# Patient Record
Sex: Female | Born: 1990 | Race: White | Hispanic: No | Marital: Married | Smoking: Never smoker
Health system: Southern US, Community
[De-identification: ages and names within clinical notes are randomized; demographics above are authoritative.]

## PROBLEM LIST (undated history)

## (undated) ENCOUNTER — Inpatient Hospital Stay (HOSPITAL_COMMUNITY): Payer: Self-pay

---

## 2015-03-29 ENCOUNTER — Inpatient Hospital Stay (HOSPITAL_COMMUNITY): Payer: 59

## 2015-03-29 ENCOUNTER — Inpatient Hospital Stay (HOSPITAL_COMMUNITY)
Admission: AD | Admit: 2015-03-29 | Discharge: 2015-03-29 | Disposition: A | Discharge status: Home / Self Care | Payer: 59 | Source: Ambulatory Visit | Attending: Obstetrics & Gynecology | Admitting: Obstetrics & Gynecology

## 2015-03-29 ENCOUNTER — Encounter (HOSPITAL_COMMUNITY): Payer: Self-pay

## 2015-03-29 DIAGNOSIS — O4691 Antepartum hemorrhage, unspecified, first trimester: Secondary | ICD-10-CM | POA: Diagnosis not present

## 2015-03-29 DIAGNOSIS — R109 Unspecified abdominal pain: Secondary | ICD-10-CM | POA: Diagnosis not present

## 2015-03-29 DIAGNOSIS — O209 Hemorrhage in early pregnancy, unspecified: Secondary | ICD-10-CM | POA: Diagnosis not present

## 2015-03-29 DIAGNOSIS — Z3A00 Weeks of gestation of pregnancy not specified: Secondary | ICD-10-CM | POA: Diagnosis not present

## 2015-03-29 LAB — WET PREP, GENITAL
Clue Cells Wet Prep HPF POC: NONE SEEN
Sperm: NONE SEEN
Trich, Wet Prep: NONE SEEN
YEAST WET PREP: NONE SEEN

## 2015-03-29 LAB — CBC
HCT: 37.6 % (ref 36.0–46.0)
HEMOGLOBIN: 12.5 g/dL (ref 12.0–15.0)
MCH: 30.2 pg (ref 26.0–34.0)
MCHC: 33.2 g/dL (ref 30.0–36.0)
MCV: 90.8 fL (ref 78.0–100.0)
PLATELETS: 325 10*3/uL (ref 150–400)
RBC: 4.14 MIL/uL (ref 3.87–5.11)
RDW: 13.7 % (ref 11.5–15.5)
WBC: 10.6 10*3/uL — ABNORMAL HIGH (ref 4.0–10.5)

## 2015-03-29 LAB — URINALYSIS, ROUTINE W REFLEX MICROSCOPIC
BILIRUBIN URINE: NEGATIVE
Glucose, UA: NEGATIVE mg/dL
Ketones, ur: NEGATIVE mg/dL
Leukocytes, UA: NEGATIVE
NITRITE: NEGATIVE
PH: 5.5 (ref 5.0–8.0)
Protein, ur: NEGATIVE mg/dL

## 2015-03-29 LAB — URINE MICROSCOPIC-ADD ON
BACTERIA UA: NONE SEEN
WBC, UA: NONE SEEN WBC/hpf (ref 0–5)

## 2015-03-29 LAB — HCG, QUANTITATIVE, PREGNANCY: HCG, BETA CHAIN, QUANT, S: 3926 m[IU]/mL — AB (ref <5)

## 2015-03-29 LAB — ABO/RH: ABO/RH(D): O POS

## 2015-03-29 LAB — POCT PREGNANCY, URINE: Preg Test, Ur: POSITIVE — AB

## 2015-03-29 NOTE — MAU Provider Note (Signed)
CSN: [id number]     Arrival date & time 03/29/15  1735 History   None    Chief Complaint  Patient presents with  . Vaginal Bleeding     (Consider location/radiation/quality/duration/timing/severity/associated sxs/prior Treatment) HPI Audrey Galloway is a 25 y.o. G1P0 with vaginal bleeding in early pregnancy. Bleeding started heavy 2 days ago with bad cramping, today less bleeding and mild cramping. Patient reports that she went to her OB/GYN in [place], [place] yesterday and they did a BHCG and told her to return on Monday for follow up. Patient states that she came here because one of her friend told her to come and get an ultrasound.   History reviewed. No pertinent past medical history. History reviewed. No pertinent past surgical history. History reviewed. No pertinent family history. Social History  Substance Use Topics  . Smoking status: None  . Smokeless tobacco: None  . Alcohol Use: None   OB History    Gravida Para Term Preterm AB TAB SAB Ectopic Multiple Living   1              Review of Systems  Gastrointestinal: Positive for abdominal pain.  Genitourinary: Positive for vaginal bleeding.  Neurological: Positive for light-headedness.  all other systems negative    Allergies  Review of patient's allergies indicates no known allergies.  Home Medications   Prior to Admission medications   Not on File   BP 144/81 mmHg  Pulse 93  Temp(Src) 98.6 F (37 C) (Oral)  Resp 16 Physical Exam  Constitutional: She is oriented to person, place, and time. She appears well-developed and well-nourished.  HENT:  Head: Normocephalic and atraumatic.  Eyes: Conjunctivae and EOM are normal.  Neck: Normal range of motion. Neck supple.  Cardiovascular: Normal rate.   Pulmonary/Chest: Effort normal.  Abdominal: Soft. There is tenderness.  Mildly tender lower abdomen  Genitourinary:  External genitalia without lesions, moderate blood vaginal vault. Cervix closed, long, no CMT,  no adnexal tenderness or mass palpated.   Musculoskeletal: Normal range of motion.  Neurological: She is alert and oriented to person, place, and time. No cranial nerve deficit.  Skin: Skin is warm and dry.  Psychiatric: She has a normal mood and affect. Her behavior is normal.  Nursing note and vitals reviewed.   ED Course  Procedures (including critical care time) Labs Review Results for orders placed or performed during the hospital encounter of 03/29/15 (from the past 24 hour(s))  Urinalysis, Routine w reflex microscopic (not at [facility])     Status: Abnormal   Collection Time: 03/29/15  5:48 PM  Result Value Ref Range   Color, Urine YELLOW YELLOW   APPearance HAZY (A) CLEAR   Specific Gravity, Urine >1.030 (H) 1.005 - 1.030   pH 5.5 5.0 - 8.0   Glucose, UA NEGATIVE NEGATIVE mg/dL   Hgb urine dipstick LARGE (A) NEGATIVE   Bilirubin Urine NEGATIVE NEGATIVE   Ketones, ur NEGATIVE NEGATIVE mg/dL   Protein, ur NEGATIVE NEGATIVE mg/dL   Nitrite NEGATIVE NEGATIVE   Leukocytes, UA NEGATIVE NEGATIVE  Urine microscopic-add on     Status: Abnormal   Collection Time: 03/29/15  5:48 PM  Result Value Ref Range   Squamous Epithelial / LPF 0-5 (A) NONE SEEN   WBC, UA NONE SEEN 0 - 5 WBC/hpf   RBC / HPF TOO NUMEROUS TO COUNT 0 - 5 RBC/hpf   Bacteria, UA NONE SEEN NONE SEEN  Pregnancy, urine POC     Status: Abnormal   Collection Time:  03/29/15  6:10 PM  Result Value Ref Range   Preg Test, Ur POSITIVE (A) NEGATIVE  hCG, quantitative, pregnancy     Status: Abnormal   Collection Time: 03/29/15  7:10 PM  Result Value Ref Range   hCG, Beta Chain, Quant, S 3926 (H) <5 mIU/mL  CBC     Status: Abnormal   Collection Time: 03/29/15  7:10 PM  Result Value Ref Range   WBC 10.6 (H) 4.0 - 10.5 K/uL   RBC 4.14 3.87 - 5.11 MIL/uL   Hemoglobin 12.5 12.0 - 15.0 g/dL   HCT 88.4 16.6 - 06.3 %   MCV 90.8 78.0 - 100.0 fL   MCH 30.2 26.0 - 34.0 pg   MCHC 33.2 30.0 - 36.0 g/dL   RDW 01.6 01.0 - 93.2 %    Platelets 325 150 - 400 K/uL  ABO/Rh     Status: None   Collection Time: 03/29/15  7:10 PM  Result Value Ref Range   ABO/RH(D) O POS   Wet prep, genital     Status: Abnormal   Collection Time: 03/29/15  7:50 PM  Result Value Ref Range   Yeast Wet Prep HPF POC NONE SEEN NONE SEEN   Trich, Wet Prep NONE SEEN NONE SEEN   Clue Cells Wet Prep HPF POC NONE SEEN NONE SEEN   WBC, Wet Prep HPF POC MODERATE (A) NONE SEEN   Sperm NONE SEEN      Imaging Review US Ob Comp Less 14 Wks  03/29/2015  CLINICAL DATA:  Vaginal bleeding for 5 days, increased today. EXAM: OBSTETRIC <14 WK [place] AND TRANSVAGINAL OB US TECHNIQUE: Both transabdominal and transvaginal ultrasound examinations were performed for complete evaluation of the gestation as well as the maternal uterus, adnexal regions, and pelvic cul-de-sac. Transvaginal technique was performed to assess early pregnancy. COMPARISON:  None. FINDINGS: Intrauterine gestational sac: Not seen. Yolk sac:  Not present Embryo:  Not present Cardiac Activity: Not present Subchorionic hemorrhage:  None visualized. Maternal uterus/adnexae: The bilateral ovaries are normal. There is no free fluid. The endometrial stripe 3.1 mm in diameter. IMPRESSION: No intrauterine gestational sac or embryo of seen. No abnormal mass is identified in the pelvis. Electronically Signed   By: Sherian Rein M.D.   On: 03/29/2015 20:33   US Ob Transvaginal  03/29/2015  CLINICAL DATA:  Vaginal bleeding for 5 days, increased today. EXAM: OBSTETRIC <14 WK [place] AND TRANSVAGINAL OB US TECHNIQUE: Both transabdominal and transvaginal ultrasound examinations were performed for complete evaluation of the gestation as well as the maternal uterus, adnexal regions, and pelvic cul-de-sac. Transvaginal technique was performed to assess early pregnancy. COMPARISON:  None. FINDINGS: Intrauterine gestational sac: Not seen. Yolk sac:  Not present Embryo:  Not present Cardiac Activity: Not present Subchorionic hemorrhage:   None visualized. Maternal uterus/adnexae: The bilateral ovaries are normal. There is no free fluid. The endometrial stripe 3.1 mm in diameter. IMPRESSION: No intrauterine gestational sac or embryo of seen. No abnormal mass is identified in the pelvis. Electronically Signed   By: Sherian Rein M.D.   On: 03/29/2015 20:33   I have personally reviewed and evaluated these images and lab results as part of my medical decision-making.   MDM  25 y.o. female with vaginal bleeding in early pregnancy stable for d/c without heavy bleeding, she is not anemic and has no pain at this time. She will follow up with her OB as scheduled. She will return here as needed for any problems.   Final diagnoses:  Bleeding in early pregnancy

## 2015-03-29 NOTE — Discharge Instructions (Signed)
Results for orders placed or performed during the hospital encounter of 03/29/15 (from the past 24 hour(s))  Urinalysis, Routine w reflex microscopic (not at [facility])     Status: Abnormal   Collection Time: 03/29/15  5:48 PM  Result Value Ref Range   Color, Urine YELLOW YELLOW   APPearance HAZY (A) CLEAR   Specific Gravity, Urine >1.030 (H) 1.005 - 1.030   pH 5.5 5.0 - 8.0   Glucose, UA NEGATIVE NEGATIVE mg/dL   Hgb urine dipstick LARGE (A) NEGATIVE   Bilirubin Urine NEGATIVE NEGATIVE   Ketones, ur NEGATIVE NEGATIVE mg/dL   Protein, ur NEGATIVE NEGATIVE mg/dL   Nitrite NEGATIVE NEGATIVE   Leukocytes, UA NEGATIVE NEGATIVE  Urine microscopic-add on     Status: Abnormal   Collection Time: 03/29/15  5:48 PM  Result Value Ref Range   Squamous Epithelial / LPF 0-5 (A) NONE SEEN   WBC, UA NONE SEEN 0 - 5 WBC/hpf   RBC / HPF TOO NUMEROUS TO COUNT 0 - 5 RBC/hpf   Bacteria, UA NONE SEEN NONE SEEN  Pregnancy, urine POC     Status: Abnormal   Collection Time: 03/29/15  6:10 PM  Result Value Ref Range   Preg Test, Ur POSITIVE (A) NEGATIVE  hCG, quantitative, pregnancy     Status: Abnormal   Collection Time: 03/29/15  7:10 PM  Result Value Ref Range   hCG, Beta Chain, Quant, S 3926 (H) <5 mIU/mL  CBC     Status: Abnormal   Collection Time: 03/29/15  7:10 PM  Result Value Ref Range   WBC 10.6 (H) 4.0 - 10.5 K/uL   RBC 4.14 3.87 - 5.11 MIL/uL   Hemoglobin 12.5 12.0 - 15.0 g/dL   HCT 16.1 09.6 - 04.5 %   MCV 90.8 78.0 - 100.0 fL   MCH 30.2 26.0 - 34.0 pg   MCHC 33.2 30.0 - 36.0 g/dL   RDW 40.9 81.1 - 91.4 %   Platelets 325 150 - 400 K/uL  ABO/Rh     Status: None   Collection Time: 03/29/15  7:10 PM  Result Value Ref Range   ABO/RH(D) O POS   Wet prep, genital     Status: Abnormal   Collection Time: 03/29/15  7:50 PM  Result Value Ref Range   Yeast Wet Prep HPF POC NONE SEEN NONE SEEN   Trich, Wet Prep NONE SEEN NONE SEEN   Clue Cells Wet Prep HPF POC NONE SEEN NONE SEEN   WBC, Wet  Prep HPF POC MODERATE (A) NONE SEEN   Sperm NONE SEEN     The results above are the lab test we did tonight.   Below are the ultrasound report  US Ob Comp Less 14 Wks  03/29/2015  CLINICAL DATA:  Vaginal bleeding for 5 days, increased today. EXAM: OBSTETRIC <14 WK [place] AND TRANSVAGINAL OB US TECHNIQUE: Both transabdominal and transvaginal ultrasound examinations were performed for complete evaluation of the gestation as well as the maternal uterus, adnexal regions, and pelvic cul-de-sac. Transvaginal technique was performed to assess early pregnancy. COMPARISON:  None. FINDINGS: Intrauterine gestational sac: Not seen. Yolk sac:  Not present Embryo:  Not present Cardiac Activity: Not present Subchorionic hemorrhage:  None visualized. Maternal uterus/adnexae: The bilateral ovaries are normal. There is no free fluid. The endometrial stripe 3.1 mm in diameter. IMPRESSION: No intrauterine gestational sac or embryo of seen. No abnormal mass is identified in the pelvis. Electronically Signed   By: Gabriel Carina.D.  On: 03/29/2015 20:33   US Ob Transvaginal  03/29/2015  CLINICAL DATA:  Vaginal bleeding for 5 days, increased today. EXAM: OBSTETRIC <14 WK [place] AND TRANSVAGINAL OB US TECHNIQUE: Both transabdominal and transvaginal ultrasound examinations were performed for complete evaluation of the gestation as well as the maternal uterus, adnexal regions, and pelvic cul-de-sac. Transvaginal technique was performed to assess early pregnancy. COMPARISON:  None. FINDINGS: Intrauterine gestational sac: Not seen. Yolk sac:  Not present Embryo:  Not present Cardiac Activity: Not present Subchorionic hemorrhage:  None visualized. Maternal uterus/adnexae: The bilateral ovaries are normal. There is no free fluid. The endometrial stripe 3.1 mm in diameter. IMPRESSION: No intrauterine gestational sac or embryo of seen. No abnormal mass is identified in the pelvis. Electronically Signed   By: Sherian Rein M.D.   On: 03/29/2015  20:33    This may indicate a miscarriage or an early pregnancy that is to early to see. You will need to see your doctor for follow up as scheduled to repeat the BHCG to see if it is rising.  Return here as needed for worsening symptoms.    Vaginal Bleeding During Pregnancy, First Trimester A small amount of bleeding (spotting) from the vagina is relatively common in early pregnancy. It usually stops on its own. Various things may cause bleeding or spotting in early pregnancy. Some bleeding may be related to the pregnancy, and some may not. In most cases, the bleeding is normal and is not a problem. However, bleeding can also be a sign of something serious. Be sure to tell your health care provider about any vaginal bleeding right away. Some possible causes of vaginal bleeding during the first trimester include:  Infection or inflammation of the cervix.  Growths (polyps) on the cervix.  Miscarriage or threatened miscarriage.  Pregnancy tissue has developed outside of the uterus and in a fallopian tube (tubal pregnancy).  Tiny cysts have developed in the uterus instead of pregnancy tissue (molar pregnancy). HOME CARE INSTRUCTIONS  Watch your condition for any changes. The following actions may help to lessen any discomfort you are feeling:  Follow your health care provider's instructions for limiting your activity. If your health care provider orders bed rest, you may need to stay in bed and only get up to use the bathroom. However, your health care provider may allow you to continue light activity.  If needed, make plans for someone to help with your regular activities and responsibilities while you are on bed rest.  Keep track of the number of pads you use each day, how often you change pads, and how soaked (saturated) they are. Write this down.  Do not use tampons. Do not douche.  Do not have sexual intercourse or orgasms until approved by your health care provider.  If you pass any  tissue from your vagina, save the tissue so you can show it to your health care provider.  Only take over-the-counter or prescription medicines as directed by your health care provider.  Do not take aspirin because it can make you bleed.  Keep all follow-up appointments as directed by your health care provider. SEEK MEDICAL CARE IF:  You have any vaginal bleeding during any part of your pregnancy.  You have cramps or labor pains.  You have a fever, not controlled by medicine. SEEK IMMEDIATE MEDICAL CARE IF:   You have severe cramps in your back or belly (abdomen).  You pass large clots or tissue from your vagina.  Your bleeding increases.  You  feel light-headed or weak, or you have fainting episodes.  You have chills.  You are leaking fluid or have a gush of fluid from your vagina.  You pass out while having a bowel movement. MAKE SURE YOU:  Understand these instructions.  Will watch your condition.  Will get help right away if you are not doing well or get worse.   This information is not intended to replace advice given to you by your health care provider. Make sure you discuss any questions you have with your health care provider.   Document Released: 11/13/2004 Document Revised: 02/08/2013 Document Reviewed: 10/11/2012 Elsevier Interactive Patient Education [employer]

## 2015-03-29 NOTE — MAU Note (Signed)
Patient presents with vaginal bleeding that startedd Saturday but has got heavier with clots the past two days.

## 2015-03-30 LAB — GC/CHLAMYDIA PROBE AMP (~~LOC~~) NOT AT ARMC
CHLAMYDIA, DNA PROBE: NEGATIVE
NEISSERIA GONORRHEA: NEGATIVE

## 2015-11-26 ENCOUNTER — Encounter: Payer: Self-pay | Admitting: Family Medicine

## 2015-11-26 ENCOUNTER — Ambulatory Visit (INDEPENDENT_AMBULATORY_CARE_PROVIDER_SITE_OTHER): Payer: 59 | Admitting: Family Medicine

## 2015-11-26 DIAGNOSIS — M25511 Pain in right shoulder: Secondary | ICD-10-CM | POA: Diagnosis not present

## 2015-11-26 DIAGNOSIS — M549 Dorsalgia, unspecified: Secondary | ICD-10-CM | POA: Diagnosis not present
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
## 2016-03-27 ENCOUNTER — Emergency Department (HOSPITAL_BASED_OUTPATIENT_CLINIC_OR_DEPARTMENT_OTHER)
Admission: EM | Admit: 2016-03-27 | Discharge: 2016-03-27 | Disposition: A | Discharge status: Home / Self Care | Payer: 59 | Attending: Emergency Medicine | Admitting: Emergency Medicine

## 2016-03-27 ENCOUNTER — Encounter (HOSPITAL_BASED_OUTPATIENT_CLINIC_OR_DEPARTMENT_OTHER): Payer: Self-pay | Admitting: *Deleted

## 2016-03-27 DIAGNOSIS — Z3201 Encounter for pregnancy test, result positive: Secondary | ICD-10-CM

## 2016-03-27 DIAGNOSIS — R110 Nausea: Secondary | ICD-10-CM | POA: Insufficient documentation

## 2016-03-27 DIAGNOSIS — Z3A01 Less than 8 weeks gestation of pregnancy: Secondary | ICD-10-CM | POA: Diagnosis not present

## 2016-03-27 LAB — URINALYSIS, ROUTINE W REFLEX MICROSCOPIC
Bilirubin Urine: NEGATIVE
Glucose, UA: NEGATIVE mg/dL
Hgb urine dipstick: NEGATIVE
Ketones, ur: NEGATIVE mg/dL
LEUKOCYTES UA: NEGATIVE
NITRITE: NEGATIVE
PH: 6 (ref 5.0–8.0)
Protein, ur: NEGATIVE mg/dL
SPECIFIC GRAVITY, URINE: 1.015 (ref 1.005–1.030)

## 2016-03-27 LAB — PREGNANCY, URINE: Preg Test, Ur: POSITIVE — AB

## 2016-03-27 MED ORDER — ONDANSETRON 4 MG PO TBDP
4.0000 mg | ORAL_TABLET | Freq: Once | ORAL | Status: AC
  Administered 2016-03-27: 4 mg via ORAL
  Filled 2016-03-27: qty 1

## 2016-03-27 MED ORDER — VITAMIN B-6 25 MG PO TABS: 25.0000 mg | ORAL_TABLET | Freq: Every day | ORAL | 0 refills | Status: AC
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
## 2016-03-27 NOTE — ED Triage Notes (Signed)
Nausea at work today. She took a home pregnancy test last week and it was negative.

## 2017-03-04 IMAGING — US US OB COMP LESS 14 WK
1 series · 15 of 28 positions shown · non-contrast
Comparison: None.

CLINICAL DATA: Vaginal bleeding for 5 days, increased today.

EXAM:
OBSTETRIC <14 WK US AND TRANSVAGINAL OB US
TECHNIQUE: Both transabdominal and transvaginal ultrasound examinations were
performed for complete evaluation of the gestation as well as the
maternal uterus, adnexal regions, and pelvic cul-de-sac.
Transvaginal technique was performed to assess early pregnancy.

[Series 1: us ob comp less 14 wk · 15 of 74 slices shown]
[im 1/74]
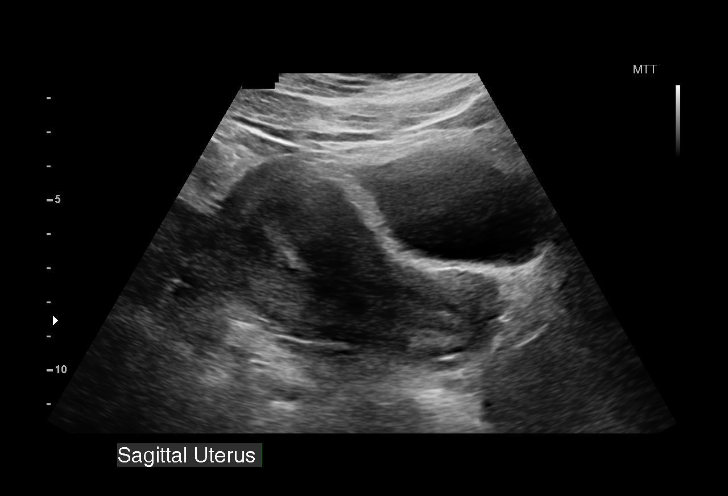
[im 6/74]
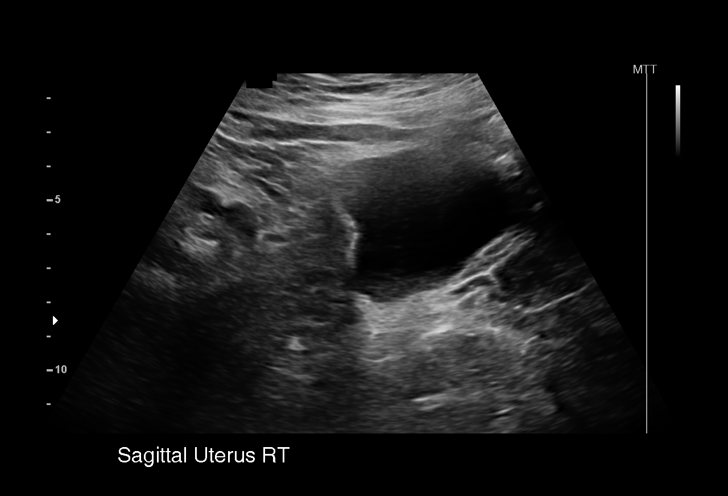
[im 11/74]
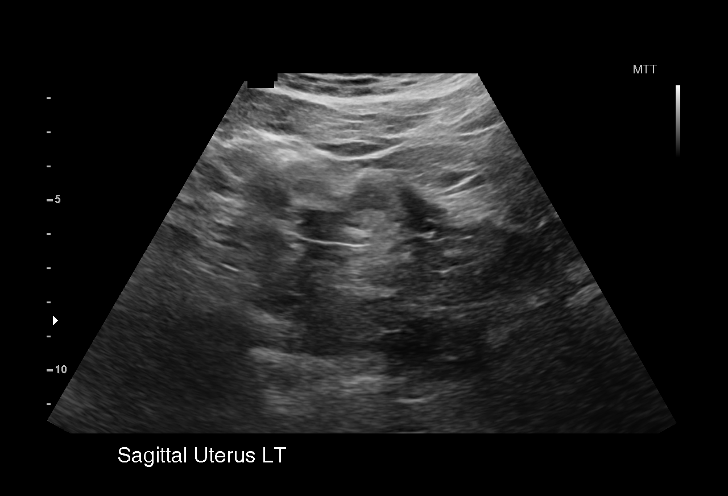
[im 17/74]
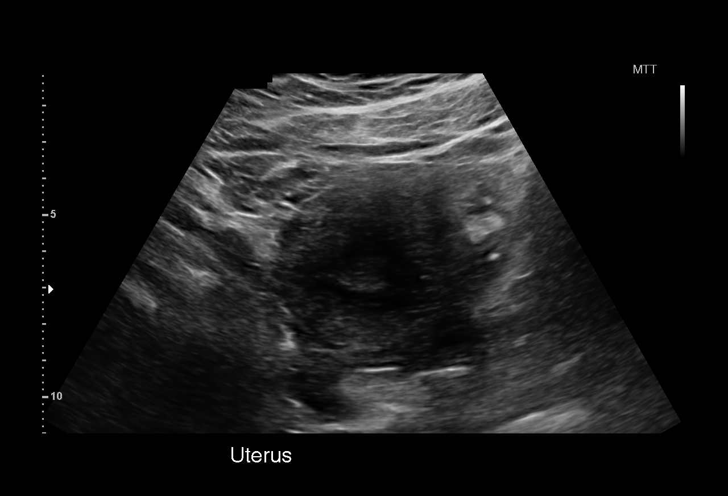
[im 22/74]
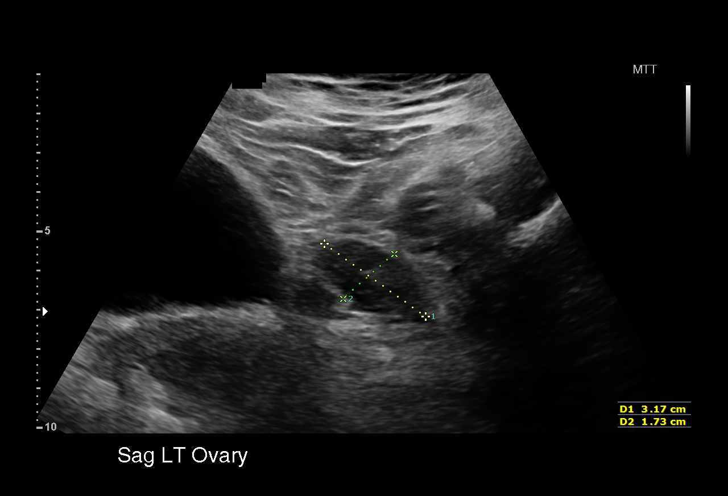
[im 28/74]
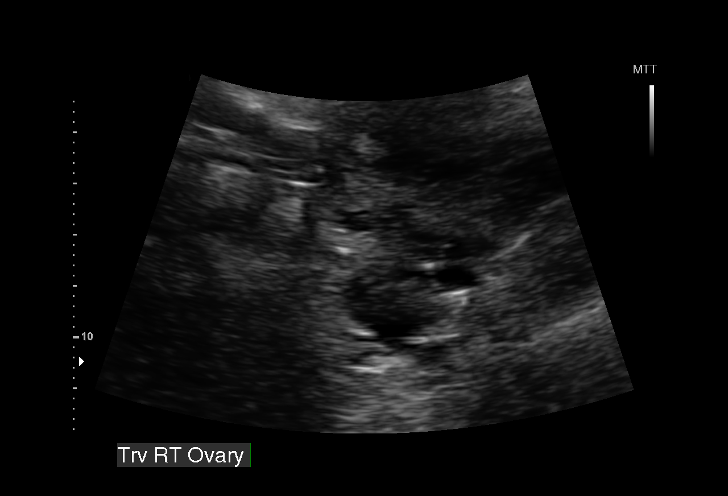
[im 33/74]
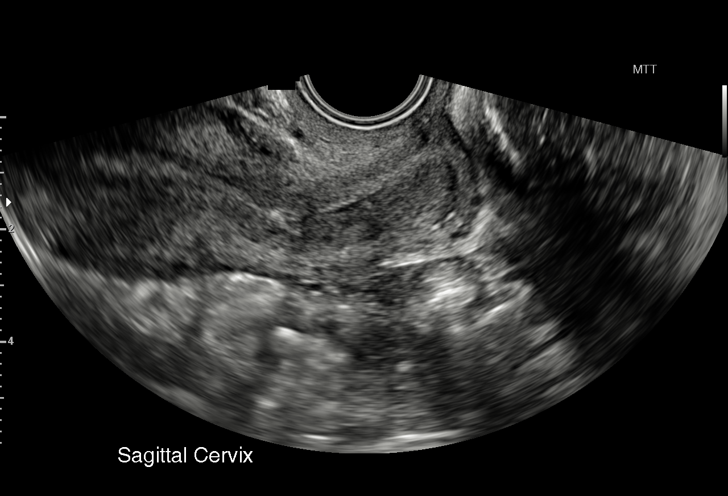
[im 38/74]
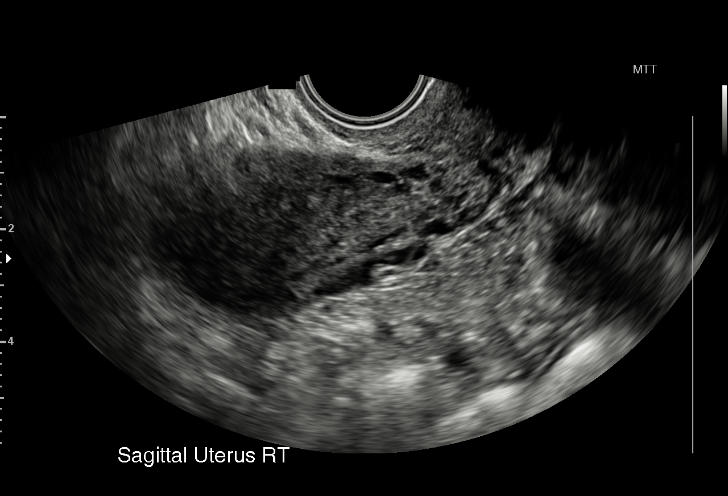
[im 41/74]
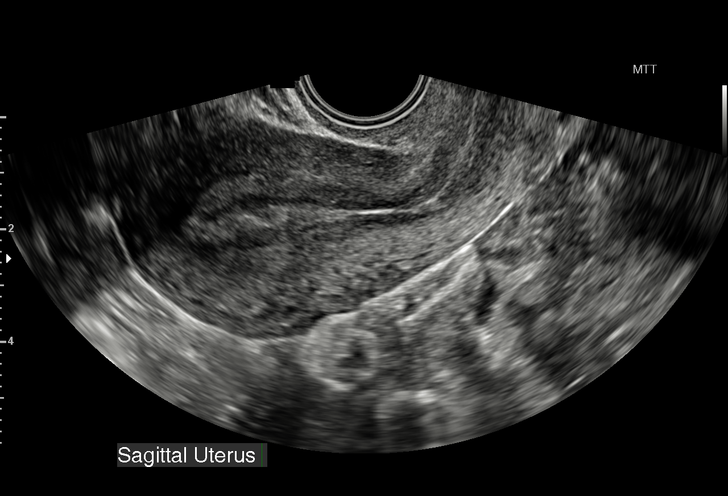
[im 46/74]
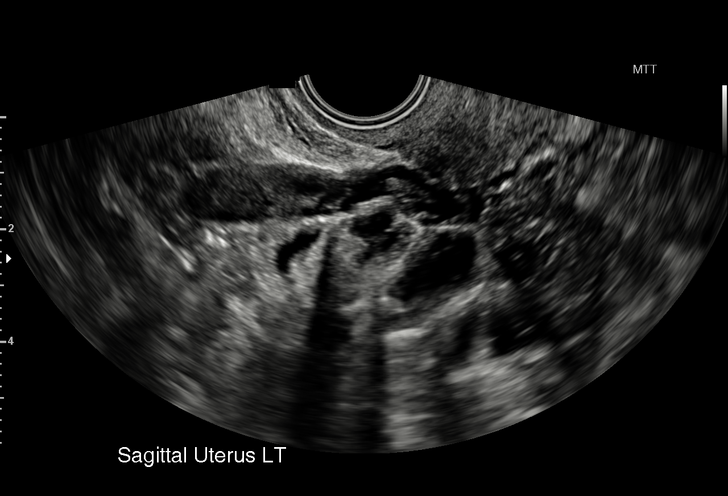
[im 52/74]
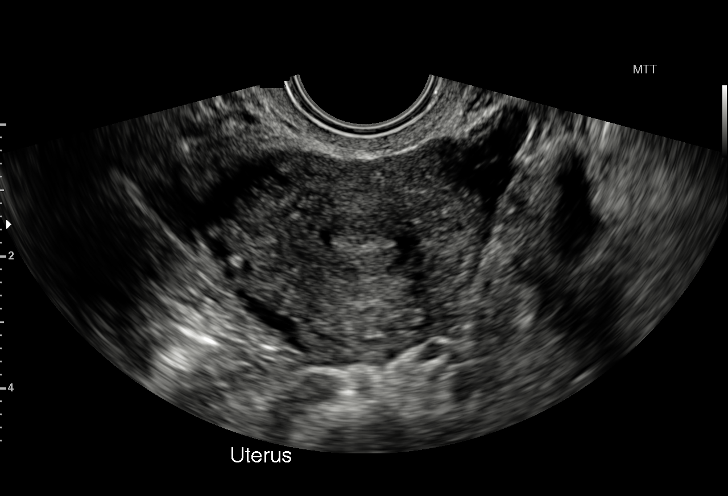
[im 57/74]
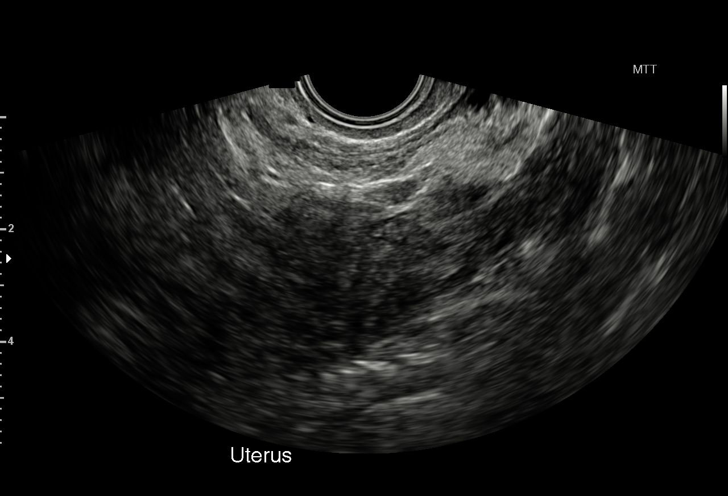
[im 63/74]
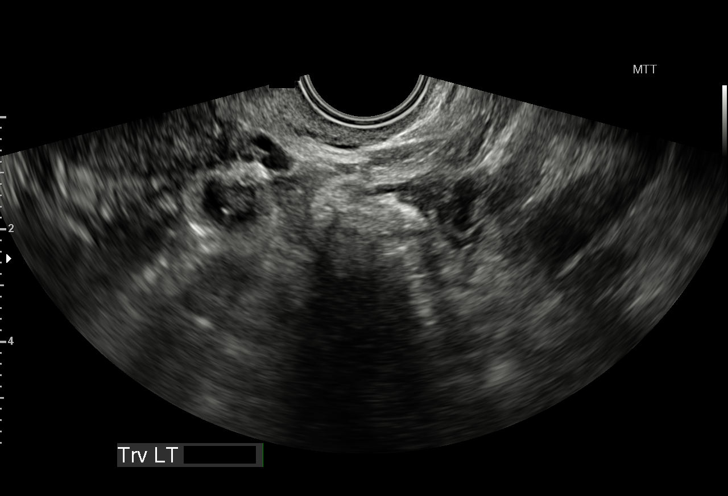
[im 68/74]
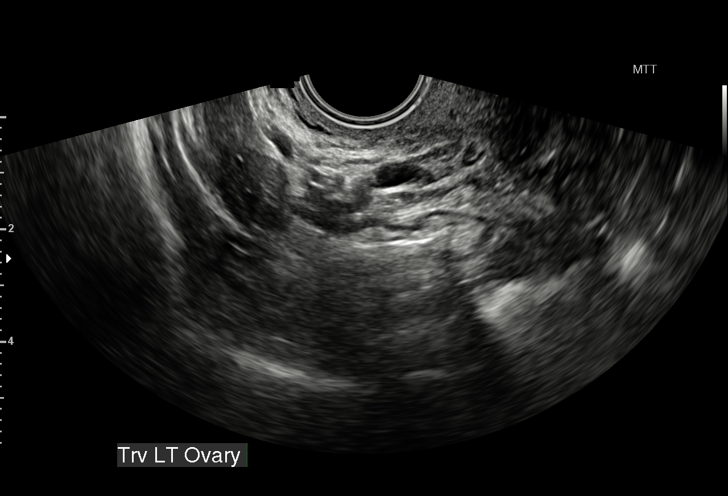
[im 74/74]
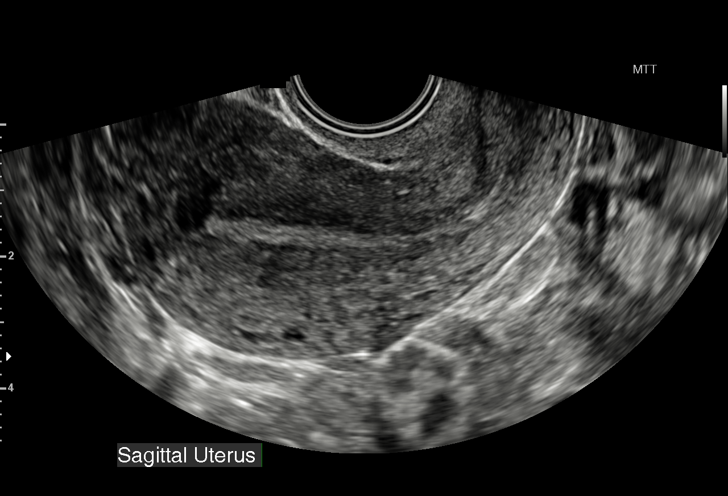

[15 of 28 positions shown; findings below may reference images not displayed]

FINDINGS: Intrauterine gestational sac: Not seen.

Yolk sac:  Not present

Embryo:  Not present

Cardiac Activity: Not present

Subchorionic hemorrhage:  None visualized.

Maternal uterus/adnexae: The bilateral ovaries are normal. There is
no free fluid. The endometrial stripe 3.1 mm in diameter.
IMPRESSION: No intrauterine gestational sac or embryo of seen. No abnormal mass
is identified in the pelvis.
